# Patient Record
Sex: Female | Born: 2000 | Race: Black or African American | Hispanic: No | Marital: Single | Smoking: Never smoker
Health system: Southern US, Community
[De-identification: ages and names within clinical notes are randomized; demographics above are authoritative.]

## PROBLEM LIST (undated history)

## (undated) DIAGNOSIS — J45909 Unspecified asthma, uncomplicated: Secondary | ICD-10-CM

---
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
## 2018-10-27 ENCOUNTER — Emergency Department (HOSPITAL_COMMUNITY)
Admission: EM | Admit: 2018-10-27 | Discharge: 2018-10-28 | Disposition: A | Discharge status: Home / Self Care | Payer: BLUE CROSS/BLUE SHIELD | Attending: Emergency Medicine | Admitting: Emergency Medicine

## 2018-10-27 ENCOUNTER — Emergency Department (HOSPITAL_COMMUNITY): Payer: BLUE CROSS/BLUE SHIELD

## 2018-10-27 DIAGNOSIS — J45909 Unspecified asthma, uncomplicated: Secondary | ICD-10-CM | POA: Insufficient documentation

## 2018-10-27 DIAGNOSIS — Z79899 Other long term (current) drug therapy: Secondary | ICD-10-CM | POA: Diagnosis not present

## 2018-10-27 DIAGNOSIS — R1011 Right upper quadrant pain: Secondary | ICD-10-CM

## 2018-10-27 LAB — CBC WITH DIFFERENTIAL/PLATELET
Abs Immature Granulocytes: 0.02 10*3/uL (ref 0.00–0.07)
BASOS PCT: 1 %
Basophils Absolute: 0.1 10*3/uL (ref 0.0–0.1)
Eosinophils Absolute: 0.5 10*3/uL (ref 0.0–0.5)
Eosinophils Relative: 5 %
HCT: 39.4 % (ref 36.0–46.0)
Hemoglobin: 13 g/dL (ref 12.0–15.0)
Immature Granulocytes: 0 %
Lymphocytes Relative: 55 %
Lymphs Abs: 5.7 10*3/uL — ABNORMAL HIGH (ref 0.7–4.0)
MCH: 28.6 pg (ref 26.0–34.0)
MCHC: 33 g/dL (ref 30.0–36.0)
MCV: 86.6 fL (ref 80.0–100.0)
MONOS PCT: 7 %
Monocytes Absolute: 0.7 10*3/uL (ref 0.1–1.0)
NEUTROS ABS: 3.2 10*3/uL (ref 1.7–7.7)
Neutrophils Relative %: 32 %
Platelets: 296 10*3/uL (ref 150–400)
RBC: 4.55 MIL/uL (ref 3.87–5.11)
RDW: 11.8 % (ref 11.5–15.5)
WBC: 10.2 10*3/uL (ref 4.0–10.5)
nRBC: 0 % (ref 0.0–0.2)

## 2018-10-27 LAB — URINALYSIS, ROUTINE W REFLEX MICROSCOPIC
Bilirubin Urine: NEGATIVE
GLUCOSE, UA: NEGATIVE mg/dL
Hgb urine dipstick: NEGATIVE
Ketones, ur: NEGATIVE mg/dL
LEUKOCYTES UA: NEGATIVE
NITRITE: NEGATIVE
Protein, ur: NEGATIVE mg/dL
Specific Gravity, Urine: 1.002 — ABNORMAL LOW (ref 1.005–1.030)
pH: 8 (ref 5.0–8.0)

## 2018-10-27 LAB — COMPREHENSIVE METABOLIC PANEL
ALBUMIN: 4 g/dL (ref 3.5–5.0)
ALT: 14 U/L (ref 0–44)
ANION GAP: 8 (ref 5–15)
AST: 20 U/L (ref 15–41)
Alkaline Phosphatase: 66 U/L (ref 38–126)
BILIRUBIN TOTAL: 0.8 mg/dL (ref 0.3–1.2)
BUN: 6 mg/dL (ref 6–20)
CO2: 26 mmol/L (ref 22–32)
Calcium: 8.8 mg/dL — ABNORMAL LOW (ref 8.9–10.3)
Chloride: 102 mmol/L (ref 98–111)
Creatinine, Ser: 0.87 mg/dL (ref 0.44–1.00)
GLUCOSE: 88 mg/dL (ref 70–99)
POTASSIUM: 3.9 mmol/L (ref 3.5–5.1)
Sodium: 136 mmol/L (ref 135–145)
TOTAL PROTEIN: 6.6 g/dL (ref 6.5–8.1)

## 2018-10-27 LAB — POC URINE PREG, ED: PREG TEST UR: NEGATIVE

## 2018-10-27 LAB — LIPASE, BLOOD: LIPASE: 27 U/L (ref 11–51)

## 2018-10-27 MED ORDER — SODIUM CHLORIDE 0.9 % IV BOLUS
1000.0000 mL | Freq: Once | INTRAVENOUS | Status: AC
  Administered 2018-10-27: 1000 mL via INTRAVENOUS

## 2018-10-27 MED ORDER — ONDANSETRON HCL 4 MG/2ML IJ SOLN
4.0000 mg | Freq: Once | INTRAMUSCULAR | Status: AC
  Administered 2018-10-27: 4 mg via INTRAVENOUS
  Filled 2018-10-27: qty 2

## 2018-10-27 MED ORDER — ALUM & MAG HYDROXIDE-SIMETH 200-200-20 MG/5ML PO SUSP
30.0000 mL | Freq: Once | ORAL | Status: AC
  Administered 2018-10-27: 30 mL via ORAL
  Filled 2018-10-27: qty 30

## 2018-10-27 NOTE — ED Notes (Signed)
Patient complaining of abdominal pain that has lasted for several weeks. Per mother patient has lost weight relating to the pain. Patient states she is nauseous, eye pain, neck pain, and headaches currently. States she has been seen by the [employer] on-site clinic at her school and attempted to change diet but it did not offer any relief. Last reported BM was today.

## 2018-10-27 NOTE — ED Provider Notes (Signed)
[facility]   CSN: [id number] Arrival date & time: 10/27/18  2141     History   Chief Complaint Chief Complaint  Patient presents with  . Abdominal Pain    HPI Ruben ImJocelyn Mattes is a 17 y.o. female.  The history is provided by the patient and medical records. No language interpreter was used.   Ruben ImJocelyn Sakurai is a 17 y.o. female  with a PMH of asthma who presents to the Emergency Department complaining of intermittent upper abdominal pain for the last 3 weeks.  Patient states her pain is worse after eating.  Denies any alleviating factors, although she has been taking omeprazole and Nexium.  Associated symptoms include nausea.  She had 2 episodes of emesis total over the last 3 weeks, none in the last several days.  [facility] who [facility] CBC and told her that her white count was a little high.  They are the ones who started her on omeprazole.  They also checked a pregnancy test which was negative.  She denies any fever or chills.  No urinary symptoms or vaginal discharge.  Denies any history of similar.  Past Medical History:  Diagnosis Date  . Asthma     There are no active problems to display for this patient.   No past surgical history on file.   OB History   None      Home Medications    Prior to Admission medications   Medication Sig Start Date End Date Taking? Authorizing Provider  calcium carbonate (TUMS - DOSED IN MG ELEMENTAL CALCIUM) 500 MG chewable tablet Chew 1 tablet by mouth daily as needed for indigestion or heartburn.   Yes [provider]  esomeprazole (NEXIUM) 20 MG capsule Take 20 mg by mouth daily at 12 noon.   Yes [provider]  omeprazole (PRILOSEC) 20 MG capsule Take 20 mg by mouth daily. 10/13/18  Yes [provider]  ondansetron (ZOFRAN ODT) 4 MG disintegrating tablet Take 1 tablet (4 mg total) by mouth every 8 (eight) hours as  needed for nausea or vomiting. 10/28/18   Ward, Chase PicketJaime Pilcher, PA-C    Family History No family history on file.  Social History Social History   Tobacco Use  . Smoking status: Never Smoker  . Smokeless tobacco: Never Used  Substance Use Topics  . Alcohol use: No  . Drug use: No     Allergies   Patient has no known allergies.   Review of Systems Review of Systems  Gastrointestinal: Positive for abdominal pain, nausea and vomiting. Negative for blood in stool, constipation and diarrhea.  All other systems reviewed and are negative.    Physical Exam Updated Vital Signs BP 125/80   Pulse 65   Temp 98.4 F (36.9 C) (Oral)   Resp 19   SpO2 96%   Physical Exam  Constitutional: She is oriented to person, place, and time. She appears well-developed and well-nourished. No distress.  Nontoxic-appearing.  HENT:  Head: Normocephalic and atraumatic.  Neck: Neck supple.  Cardiovascular: Normal rate, regular rhythm and normal heart sounds.  No murmur heard. Pulmonary/Chest: Effort normal and breath sounds normal. No respiratory distress.  Abdominal: Soft. She exhibits no distension.  Tenderness to epigastrium and right upper quadrant without rebound or guarding.  Negative Murphy sign.  No CVA tenderness.  Neurological: She is alert and oriented to person, place, and time.  Skin: Skin is warm and dry.  Nursing note and vitals reviewed.    ED Treatments / Results  Labs (all labs ordered are listed, but only abnormal results are displayed) Labs Reviewed  CBC WITH DIFFERENTIAL/PLATELET - Abnormal; Notable for the following components:      Result Value   Lymphs Abs 5.7 (*)    All other components within normal limits  COMPREHENSIVE METABOLIC PANEL - Abnormal; Notable for the following components:   Calcium 8.8 (*)    All other components within normal limits  URINALYSIS, ROUTINE W REFLEX MICROSCOPIC - Abnormal; Notable for the following components:   Color, Urine  COLORLESS (*)    Specific Gravity, Urine 1.002 (*)    All other components within normal limits  LIPASE, BLOOD  POC URINE PREG, ED    EKG None  Radiology US Abdomen Limited Ruq  Result Date: 10/27/2018 CLINICAL DATA:  Initial evaluation for acute right upper quadrant pain. EXAM: ULTRASOUND ABDOMEN LIMITED RIGHT UPPER QUADRANT COMPARISON:  None. FINDINGS: Gallbladder: No gallstones or wall thickening visualized. No sonographic Murphy sign noted by sonographer. Common bile duct: Diameter: 2.7 mm Liver: No focal lesion identified. Within normal limits in parenchymal echogenicity. Portal vein is patent on color Doppler imaging with normal direction of blood flow towards the liver. IMPRESSION: Normal right upper quadrant ultrasound. No cholelithiasis or sonographic features to suggest acute cholecystitis. No biliary dilatation. Electronically Signed   By: Rise Mu M.D.   On: 10/27/2018 23:16    Procedures Procedures (including critical care time)  Medications Ordered in ED Medications  sodium chloride 0.9 % bolus 1,000 mL (1,000 mLs Intravenous New Bag/Given 10/27/18 2352)  ondansetron (ZOFRAN) injection 4 mg (4 mg Intravenous Given 10/27/18 2233)  alum & mag hydroxide-simeth (MAALOX/MYLANTA) 200-200-20 MG/5ML suspension 30 mL (30 mLs Oral Given 10/27/18 2233)     Initial Impression / Assessment and Plan / ED Course  I have reviewed the triage vital signs and the nursing notes.  Pertinent labs & imaging results that were available during my care of the patient were reviewed by me and considered in my medical decision making (see chart for details).    Jonna Dittrich is a 17 y.o. female who presents to ED for upper abdominal pain for the last 3 weeks which gets worse after eating.  Associated with nausea.  On exam, patient is afebrile, hemodynamically stable with tenderness to the epigastrium and right upper quadrant, however no rebound or guarding.  Negative Murphy sign.   Will obtain labs and right upper quadrant ultrasound for further evaluation.  Fluids and symptomatic management provided.  Labs reviewed and reassuring.  Ultrasound with no acute abnormalities.   Feels improved after symptomatic management in the emergency department.  Repeat abdominal exam with no peritoneal signs.  Likely gastritis versus PUD versus GERD.  Will refer to GI for further work-up.  Patient and mother understand return precautions and follow-up plan.  Recommended that she continue omeprazole.  Rx for Zofran provided as well.  All questions answered.  Final Clinical Impressions(s) / ED Diagnoses   Final diagnoses:  RUQ pain    ED Discharge Orders         Ordered    ondansetron (ZOFRAN ODT) 4 MG disintegrating tablet  Every 8 hours PRN     10/28/18 0014           Ward, Chase Picket, PA-C 10/28/18 0018    Zadie Rhine, MD 10/28/18 [phone]

## 2018-10-28 MED ORDER — ONDANSETRON 4 MG PO TBDP: 4.0000 mg | ORAL_TABLET | Freq: Three times a day (TID) | ORAL | 0 refills | Status: AC | PRN

## 2018-10-28 NOTE — Discharge Instructions (Signed)
It was my pleasure taking care of you today!   Fortunately, your lab work and imaging was very reassuring.   Please call the GI doctor listed on Monday morning to schedule a follow up appointment.   It is VERY important that you monitor your symptoms and return to the Emergency Department if you develop any of the following symptoms:  You have a fever.  You keep throwing up and can't keep fluids down despite nausea medication. You pass bloody or black tarry stools.  There is bright red blood in the stool. You have any questions or concerns.

## 2018-11-07 ENCOUNTER — Other Ambulatory Visit: Payer: Self-pay | Admitting: Gastroenterology

## 2018-11-07 DIAGNOSIS — R1013 Epigastric pain: Secondary | ICD-10-CM

## 2018-11-14 ENCOUNTER — Ambulatory Visit
Admission: RE | Admit: 2018-11-14 | Discharge: 2018-11-14 | Disposition: A | Discharge status: Home / Self Care | Payer: BLUE CROSS/BLUE SHIELD | Source: Ambulatory Visit | Attending: Gastroenterology | Admitting: Gastroenterology

## 2018-11-14 DIAGNOSIS — R1013 Epigastric pain: Secondary | ICD-10-CM

## 2018-11-14 MED ORDER — IOPAMIDOL (ISOVUE-300) INJECTION 61%
100.0000 mL | Freq: Once | INTRAVENOUS | Status: AC | PRN
  Administered 2018-11-14: 100 mL via INTRAVENOUS

## 2019-07-28 ENCOUNTER — Other Ambulatory Visit: Payer: Self-pay

## 2019-07-28 DIAGNOSIS — Z20822 Contact with and (suspected) exposure to covid-19: Secondary | ICD-10-CM

## 2019-07-29 LAB — NOVEL CORONAVIRUS, NAA: SARS-CoV-2, NAA: NOT DETECTED

## 2020-09-23 IMAGING — US US ABDOMEN LIMITED
1 series · 14 of 25 positions shown · non-contrast
Comparison: None.

CLINICAL DATA: Initial evaluation for acute right upper quadrant
pain.

EXAM:
ULTRASOUND ABDOMEN LIMITED RIGHT UPPER QUADRANT

[Series 1: us abdomen limited · 0.22mm/px · 14 of 41 slices shown]
[im 1/41]
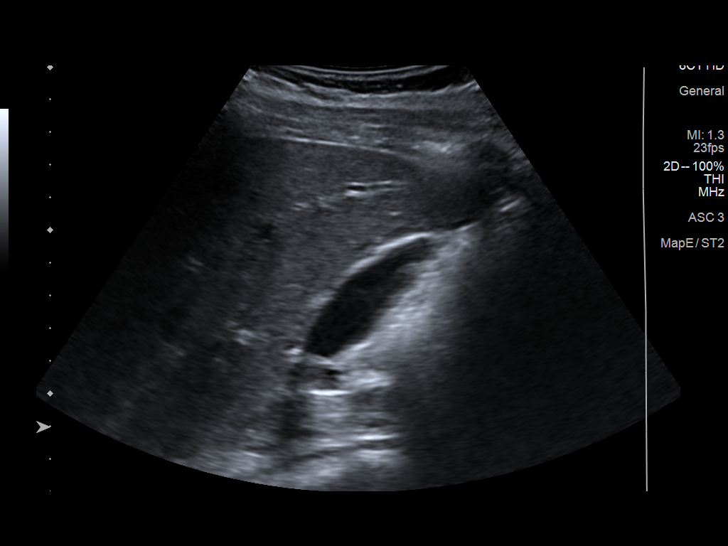
[im 4/41]
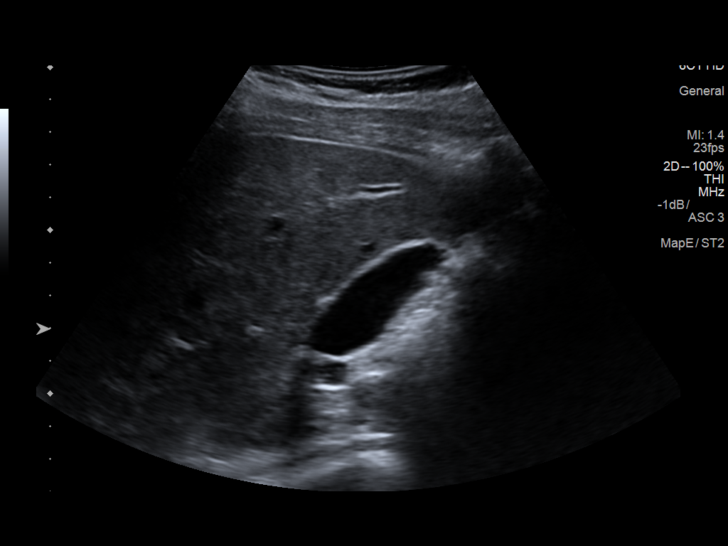
[im 7/41]
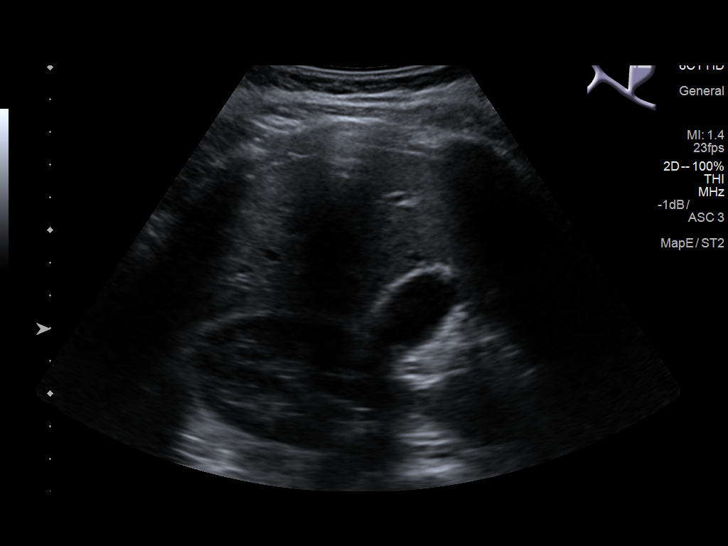
[im 11/41]
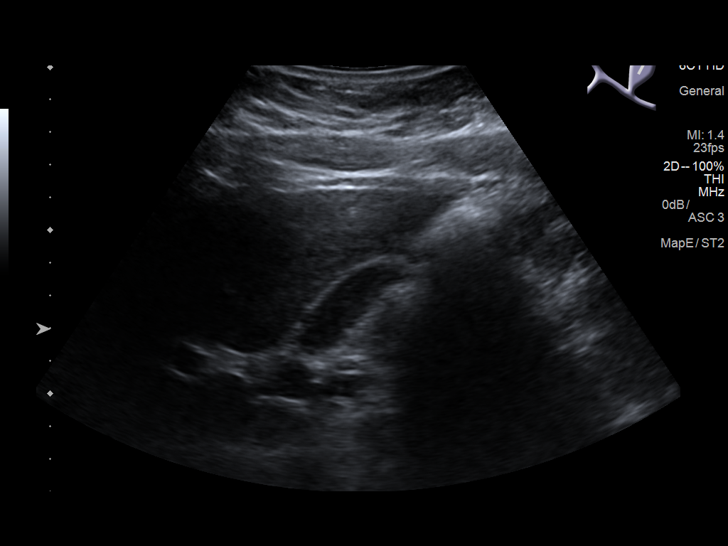
[im 14/41]
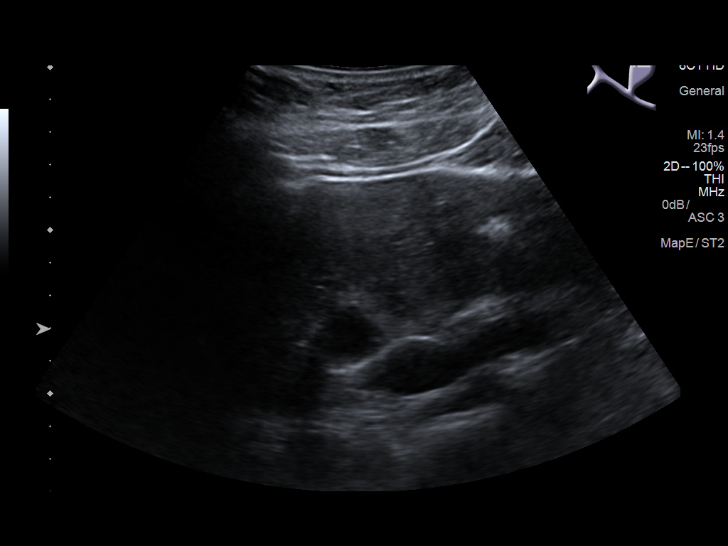
[im 16/41]
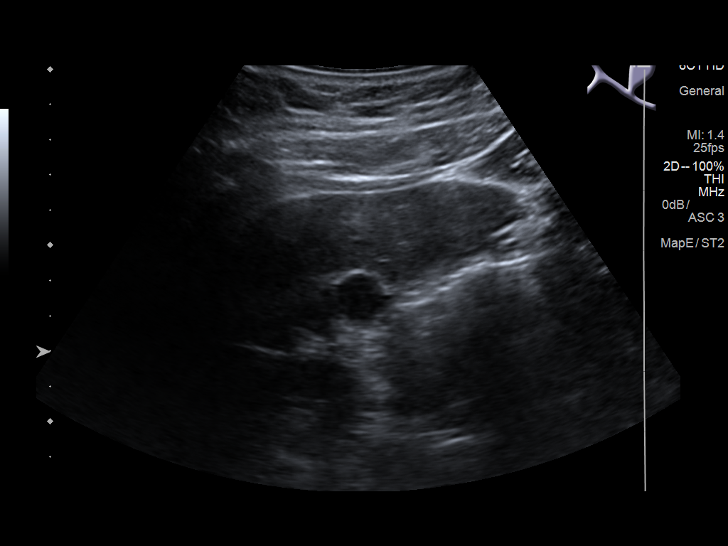
[im 19/41]
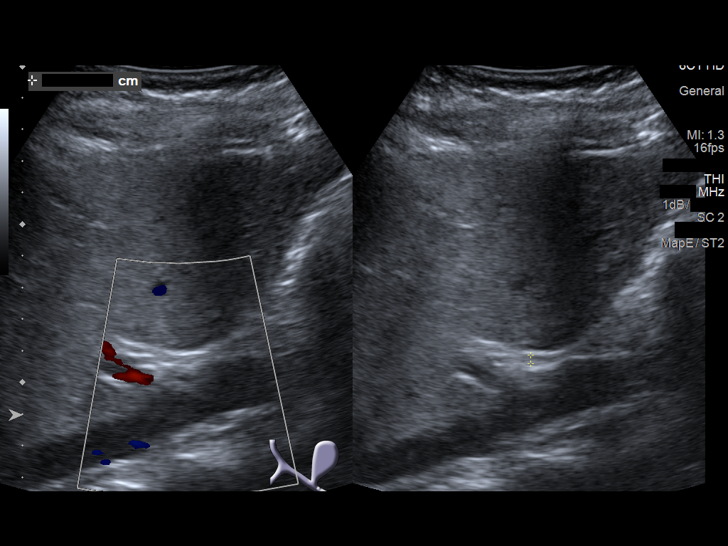
[im 22/41]
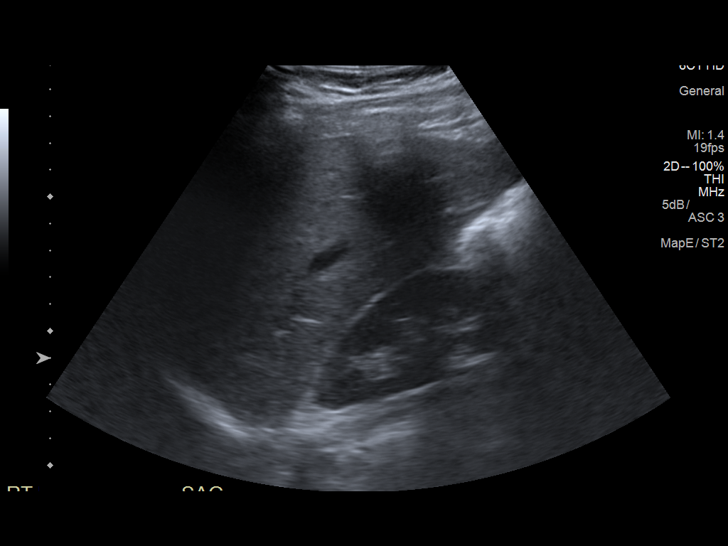
[im 26/41]
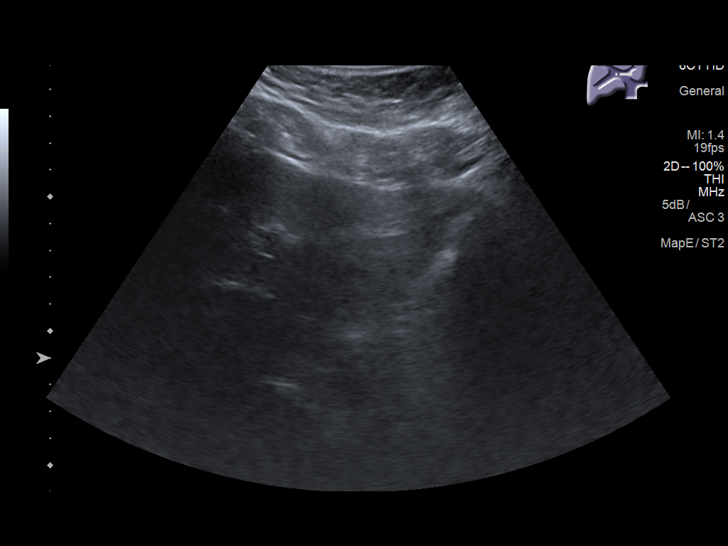
[im 27/41]
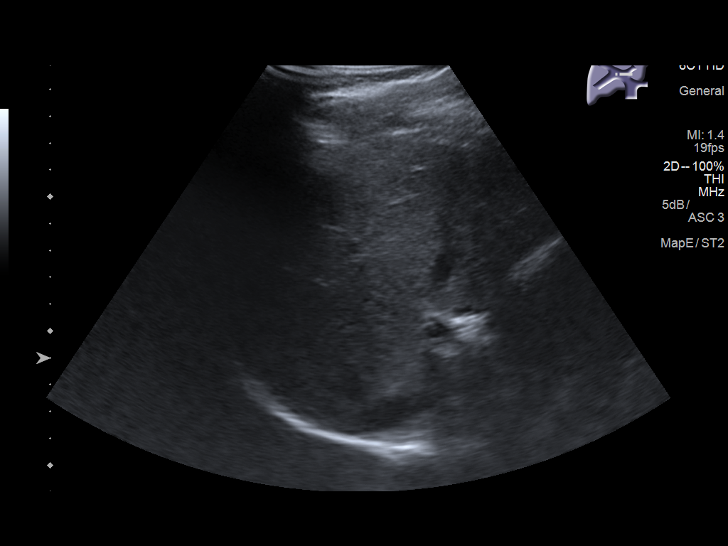
[im 31/41]
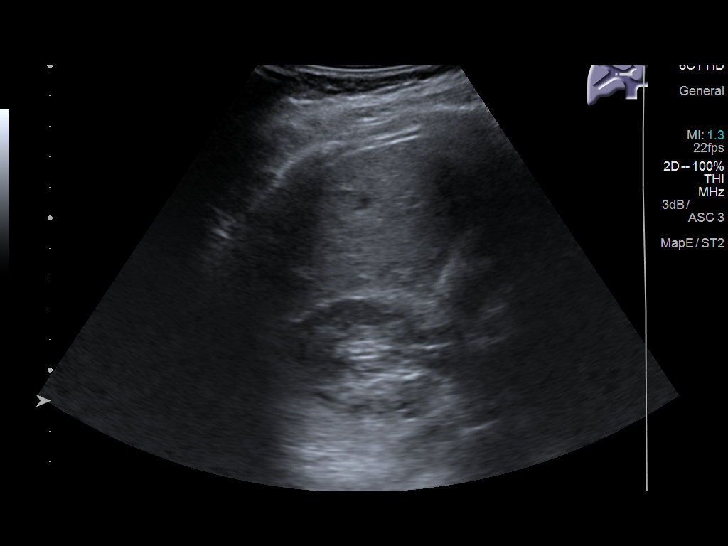
[im 34/41]
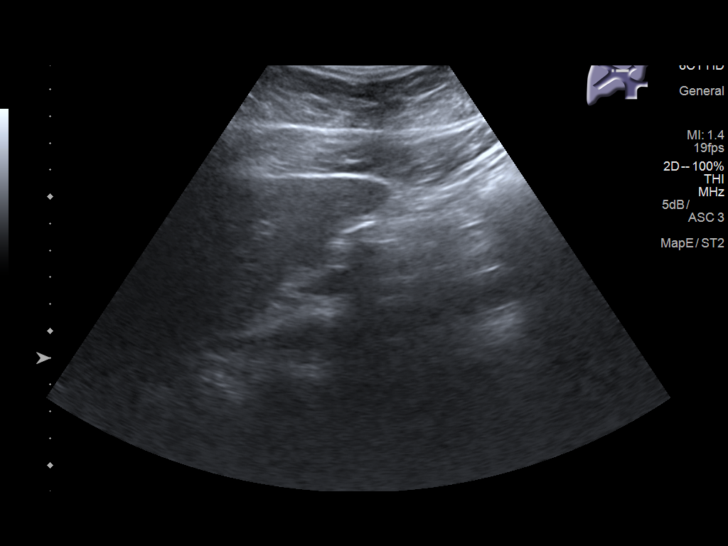
[im 37/41]
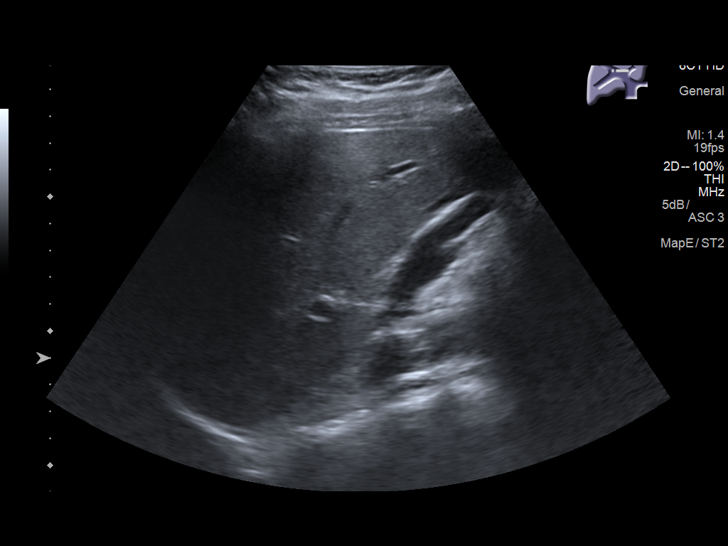
[im 41/41]
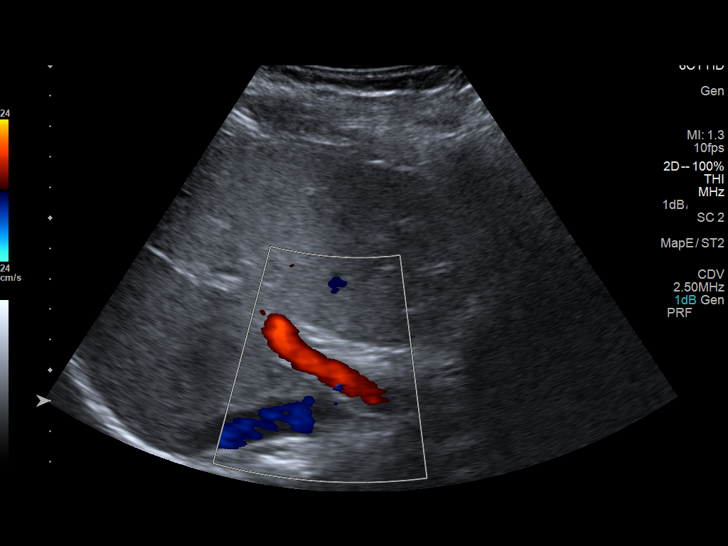

[14 of 25 positions shown; findings below may reference images not displayed]

FINDINGS: Gallbladder:

No gallstones or wall thickening visualized. No sonographic Murphy
sign noted by sonographer.

Common bile duct:

Diameter: 2.7 mm

Liver:

No focal lesion identified. Within normal limits in parenchymal
echogenicity. Portal vein is patent on color Doppler imaging with
normal direction of blood flow towards the liver.
IMPRESSION: Normal right upper quadrant ultrasound. No cholelithiasis or
sonographic features to suggest acute cholecystitis. No biliary
dilatation.
